# Patient Record
Sex: Female | Born: 1965 | Hispanic: No | Marital: Married | Smoking: Never smoker
Health system: Southern US, Community
[De-identification: ages and names within clinical notes are randomized; demographics above are authoritative.]

---

## 2006-02-06 ENCOUNTER — Other Ambulatory Visit: Admission: RE | Admit: 2006-02-06 | Discharge: 2006-02-06 | Payer: Self-pay | Admitting: Family Medicine

## 2007-03-27 ENCOUNTER — Other Ambulatory Visit: Admission: RE | Admit: 2007-03-27 | Discharge: 2007-03-27 | Payer: Self-pay | Admitting: Family Medicine

## 2008-10-14 ENCOUNTER — Other Ambulatory Visit: Admission: RE | Admit: 2008-10-14 | Discharge: 2008-10-14 | Payer: Self-pay | Admitting: Family Medicine

## 2010-03-20 ENCOUNTER — Encounter: Payer: Self-pay | Admitting: Family Medicine

## 2011-11-28 ENCOUNTER — Telehealth: Payer: Self-pay | Admitting: *Deleted

## 2011-11-28 ENCOUNTER — Ambulatory Visit (INDEPENDENT_AMBULATORY_CARE_PROVIDER_SITE_OTHER): Payer: BC Managed Care – PPO | Admitting: Gynecology

## 2011-11-28 ENCOUNTER — Encounter: Payer: Self-pay | Admitting: Gynecology

## 2011-11-28 VITALS — BP 140/92 | Ht 65.0 in | Wt 140.0 lb

## 2011-11-28 DIAGNOSIS — N63 Unspecified lump in unspecified breast: Secondary | ICD-10-CM

## 2011-11-28 DIAGNOSIS — N644 Mastodynia: Secondary | ICD-10-CM

## 2011-11-28 DIAGNOSIS — N6310 Unspecified lump in the right breast, unspecified quadrant: Secondary | ICD-10-CM

## 2011-11-28 NOTE — Telephone Encounter (Signed)
Order placed for bil. mammogram.

## 2011-11-28 NOTE — Telephone Encounter (Signed)
Message copied by Aura Camps on Tue Nov 28, 2011  4:19 PM ------      Message from: Ok Edwards      Created: Tue Nov 28, 2011  4:06 PM       Victorino Dike, please schedule diagnostic mammogram of the right breast ( mass periareolar region from 4- 6 0'clock position). Thanks JF

## 2011-11-28 NOTE — Patient Instructions (Addendum)
Breast Cyst A breast cyst is a sac in your breast that is filled with fluid. This is common in women. Women can have one or many cysts. When the breasts contain many cysts, it is usually due to a noncancerous (benign) condition called fibrocystic change. These lumps form under the influence of female hormones (estrogen and progesterone). The lumps are most often located in the upper, outer portion of the breast. They are often more swollen, painful, and tender before your period starts. They usually disappear after menopause, unless you are on hormone therapy. Different types of cysts:  Macrocysts. These are cysts that are about 2 inches (5.1 cm) in diameter.  Microcysts. These are tiny cysts that you cannot feel, but that are seen with a mammogram or an ultrasound.  Galactocele. This is a cyst containing milk, which develops when and if you suddenly stop breastfeeding.  Sebaceous cyst of the skin (not in the breast tissue itself). These are not cancerous. Breast cysts do not increase your chance of getting breast cancer. However, they must be followed and treated closely, because a cyst can be cancerous. Be sure to see your caregiver for follow-up care as recommended.  CAUSES   It is not completely known what causes a breast cyst.  Estrogen may influence the development of a breast cyst.  An overgrowth of milk glands and connective tissue in the breast can block the milk glands, causing them to fill with fluid.  Scar tissue in the breast from previous surgery may block the glands, causing a cyst. SYMPTOMS   Feeling a smooth, round, soft lump (like a grape) in the breast that is easily moveable.  Breast discomfort or pain, especially in the area of the cyst.  Increase in size of the lump before your menstrual period, and decrease in size after your menstrual period. DIAGNOSIS   The cyst can be felt during an exam by your caregiver.  Mammogram (breast X-ray).  Ultrasound.  Removing  fluid from the cyst with a needle (fine needle aspiration). TREATMENT   Your caregiver may feel there is no reason for treatment. He or she may watch to see if it goes away on its own.  Hormone treatment.  Needle aspiration. There is a 40% chance of the cyst recurring after aspiration.  Surgery to remove the whole cyst. HOME CARE INSTRUCTIONS   Get a yearly exam by your caregiver.  Practice "breast self-awareness." This means understanding the normal appearance and feel of your breasts and may include breast self-examination.  Have a clinical breast exam (CBE) by a caregiver every 1 to 3 years if you are 20 to 45 years of age. After age 40, you should have a CBE every year.  Get mammogram tests as directed by your caregiver.  Only take over-the-counter pain medicine as directed by your caregiver.  Wear a good support bra, especially when exercising.  Avoid caffeine.  Reduce your salt intake, especially before your menstrual period. Too much salt can cause fluid retention, breast swelling, and discomfort. SEEK MEDICAL CARE IF:   You feel, or think you feel, a lump in your breast.  You notice that both breasts look different than usual.  You notice that both breasts feel different than before.  Your breast is still causing pain, after your menstrual period is over.  You need medicine for breast pain and swelling that occurs with your menstrual period. SEEK IMMEDIATE MEDICAL CARE IF:   You develop severe pain, tenderness, redness, or warmth in your   causing pain, after your menstrual period is over.   You need medicine for breast pain and swelling that occurs with your menstrual period.  SEEK IMMEDIATE MEDICAL CARE IF:    You develop severe pain, tenderness, redness, or warmth in your breast.   You develop nipple discharge or bleeding.   Your breast lump becomes hard and painful.   You find new lumps or bumps that were not there before.   You feel lumps in your armpit (axilla).   You notice dimpling or wrinkling of the breast or nipple.   You have a fever.  Document Released: 02/13/2005 Document Revised: 05/08/2011 Document Reviewed: 06/05/2008  [employer] Patient Information 2013 [employer].

## 2011-11-28 NOTE — Progress Notes (Signed)
Patient is a 46 year old who presented to the office with complaint of a right tender area since August of this year. She was in [place] in August of 2013 because of the complaints she went to the doctor who ordered what appears to be an ultrasound for right breast and she brought the report but since it is in [place] language I could not interpreted it. It appears she may have had a right breast cyst. They had informed her to followup in 3 months. This is the reason she came to the office today. Her last mammogram was in November of 2012 which she stated was normal. There using condoms for contraception. She's having normal menstrual cycles. She stated in August and she or her complete gynecological examination and Pap smear was done.  Breast exam were undertaken the sitting and supine position:  Physical Exam  Pulmonary/Chest:     there were no other palpable masses and no supraclavicular or axillary lymphadenopathy on either breast.   Assessment/plan: Right. Areolar mass tender from 4 to 7:00 position. Patient will be sent for a diagnostic mammogram this week. She will bring a picture of the report from [place] to that appointment for the radiologist to review as well.

## 2011-11-29 ENCOUNTER — Other Ambulatory Visit: Payer: Self-pay | Admitting: Gynecology

## 2011-11-29 DIAGNOSIS — N63 Unspecified lump in unspecified breast: Secondary | ICD-10-CM

## 2011-11-30 NOTE — Telephone Encounter (Signed)
Breast center left message on 11/29/11 for pt to call.

## 2011-12-05 NOTE — Telephone Encounter (Signed)
Appointment 12/11/11 @ 1:10pm

## 2011-12-11 ENCOUNTER — Ambulatory Visit
Admission: RE | Admit: 2011-12-11 | Discharge: 2011-12-11 | Disposition: A | Discharge status: Home / Self Care | Payer: BC Managed Care – PPO | Source: Ambulatory Visit | Attending: Gynecology | Admitting: Gynecology

## 2011-12-11 DIAGNOSIS — N63 Unspecified lump in unspecified breast: Secondary | ICD-10-CM

## 2012-08-19 ENCOUNTER — Other Ambulatory Visit: Payer: Self-pay

## 2012-08-19 DIAGNOSIS — Z1231 Encounter for screening mammogram for malignant neoplasm of breast: Secondary | ICD-10-CM

## 2012-09-11 ENCOUNTER — Ambulatory Visit
Admission: RE | Admit: 2012-09-11 | Discharge: 2012-09-11 | Disposition: A | Discharge status: Home / Self Care | Payer: BC Managed Care – PPO | Source: Ambulatory Visit

## 2012-09-11 DIAGNOSIS — Z1231 Encounter for screening mammogram for malignant neoplasm of breast: Secondary | ICD-10-CM

## 2013-12-29 ENCOUNTER — Encounter: Payer: Self-pay | Admitting: Gynecology

## 2016-03-22 ENCOUNTER — Other Ambulatory Visit: Payer: Self-pay | Admitting: Family Medicine

## 2016-03-22 DIAGNOSIS — Z1231 Encounter for screening mammogram for malignant neoplasm of breast: Secondary | ICD-10-CM

## 2016-04-10 ENCOUNTER — Ambulatory Visit: Payer: Self-pay

## 2016-04-18 ENCOUNTER — Ambulatory Visit
Admission: RE | Admit: 2016-04-18 | Discharge: 2016-04-18 | Disposition: A | Discharge status: Home / Self Care | Payer: 59 | Source: Ambulatory Visit | Attending: Family Medicine | Admitting: Family Medicine

## 2016-04-18 DIAGNOSIS — Z1231 Encounter for screening mammogram for malignant neoplasm of breast: Secondary | ICD-10-CM

## 2018-03-27 ENCOUNTER — Other Ambulatory Visit: Payer: Self-pay | Admitting: Family Medicine

## 2018-03-27 DIAGNOSIS — Z1231 Encounter for screening mammogram for malignant neoplasm of breast: Secondary | ICD-10-CM

## 2018-04-25 ENCOUNTER — Other Ambulatory Visit: Payer: Self-pay | Admitting: Family Medicine

## 2018-04-25 ENCOUNTER — Ambulatory Visit
Admission: RE | Admit: 2018-04-25 | Discharge: 2018-04-25 | Disposition: A | Discharge status: Home / Self Care | Payer: BC Managed Care – PPO | Source: Ambulatory Visit | Attending: Family Medicine | Admitting: Family Medicine

## 2018-04-25 DIAGNOSIS — Z1231 Encounter for screening mammogram for malignant neoplasm of breast: Secondary | ICD-10-CM

## 2019-04-26 ENCOUNTER — Ambulatory Visit: Payer: BC Managed Care – PPO | Attending: Internal Medicine

## 2019-04-26 DIAGNOSIS — Z23 Encounter for immunization: Secondary | ICD-10-CM

## 2019-04-26 NOTE — Progress Notes (Signed)
   Covid-19 Vaccination Clinic  Name:  Jill Lowe    MRN: [id number] DOB: 1966/02/08  04/26/2019  Jill Lowe was observed post Covid-19 immunization for 15 minutes without incidence. She was provided with Vaccine Information Sheet and instruction to access the V-Safe system.   Jill Lowe was instructed to call 911 with any severe reactions post vaccine: Marland Kitchen Difficulty breathing  . Swelling of your face and throat  . A fast heartbeat  . A bad rash all over your body  . Dizziness and weakness    Immunizations Administered    Name Date Dose VIS Date Route   Pfizer COVID-19 Vaccine 04/26/2019  5:29 PM 0.3 mL 02/07/2019 Intramuscular   Manufacturer: [employer], [employer]   Lot: [id number]   NDC: [address]-8

## 2019-05-17 ENCOUNTER — Ambulatory Visit: Payer: BC Managed Care – PPO | Attending: Internal Medicine

## 2019-05-17 DIAGNOSIS — Z23 Encounter for immunization: Secondary | ICD-10-CM

## 2019-05-17 NOTE — Progress Notes (Signed)
   Covid-19 Vaccination Clinic  Name:  Jill Lowe    MRN: [id number] DOB: [birth year]  05/17/2019  [name] was observed post Covid-19 immunization for 15 minutes without incident. She was provided with Vaccine Information Sheet and instruction to access the V-Safe system.   [name] was instructed to call 911 with any severe reactions post vaccine: Marland Kitchen Difficulty breathing  . Swelling of face and throat  . A fast heartbeat  . A bad rash all over body  . Dizziness and weakness   Immunizations Administered    Name Date Dose VIS Date Route   Pfizer COVID-19 Vaccine 05/17/2019  9:28 AM 0.3 mL 02/07/2019 Intramuscular   Manufacturer: [employer], [employer]   Lot: [id number]   NDC: [address]-8

## 2019-05-28 ENCOUNTER — Ambulatory Visit
Admission: RE | Admit: 2019-05-28 | Discharge: 2019-05-28 | Disposition: A | Discharge status: Home / Self Care | Payer: BC Managed Care – PPO | Source: Ambulatory Visit | Attending: Family Medicine | Admitting: Family Medicine

## 2019-05-28 ENCOUNTER — Other Ambulatory Visit: Payer: Self-pay

## 2019-05-28 ENCOUNTER — Other Ambulatory Visit: Payer: Self-pay | Admitting: Family Medicine

## 2019-05-28 DIAGNOSIS — Z1231 Encounter for screening mammogram for malignant neoplasm of breast: Secondary | ICD-10-CM

## 2020-05-19 ENCOUNTER — Other Ambulatory Visit: Payer: Self-pay | Admitting: Internal Medicine

## 2020-05-19 DIAGNOSIS — Z1231 Encounter for screening mammogram for malignant neoplasm of breast: Secondary | ICD-10-CM

## 2020-07-12 ENCOUNTER — Other Ambulatory Visit: Payer: Self-pay

## 2020-07-12 ENCOUNTER — Ambulatory Visit
Admission: RE | Admit: 2020-07-12 | Discharge: 2020-07-12 | Disposition: A | Discharge status: Home / Self Care | Payer: BC Managed Care – PPO | Source: Ambulatory Visit | Attending: Internal Medicine | Admitting: Internal Medicine

## 2020-07-12 DIAGNOSIS — Z1231 Encounter for screening mammogram for malignant neoplasm of breast: Secondary | ICD-10-CM

## 2020-07-13 ENCOUNTER — Other Ambulatory Visit: Payer: Self-pay | Admitting: Internal Medicine

## 2020-07-13 DIAGNOSIS — R928 Other abnormal and inconclusive findings on diagnostic imaging of breast: Secondary | ICD-10-CM

## 2020-08-02 ENCOUNTER — Other Ambulatory Visit: Payer: Self-pay

## 2020-08-02 ENCOUNTER — Other Ambulatory Visit: Payer: Self-pay | Admitting: Internal Medicine

## 2020-08-02 ENCOUNTER — Ambulatory Visit
Admission: RE | Admit: 2020-08-02 | Discharge: 2020-08-02 | Disposition: A | Discharge status: Home / Self Care | Payer: BC Managed Care – PPO | Source: Ambulatory Visit | Attending: Internal Medicine | Admitting: Internal Medicine

## 2020-08-02 DIAGNOSIS — R928 Other abnormal and inconclusive findings on diagnostic imaging of breast: Secondary | ICD-10-CM

## 2020-08-02 DIAGNOSIS — N6489 Other specified disorders of breast: Secondary | ICD-10-CM

## 2020-08-05 ENCOUNTER — Other Ambulatory Visit: Payer: Self-pay | Admitting: Internal Medicine

## 2020-08-05 ENCOUNTER — Other Ambulatory Visit: Payer: Self-pay

## 2020-08-05 ENCOUNTER — Ambulatory Visit
Admission: RE | Admit: 2020-08-05 | Discharge: 2020-08-05 | Disposition: A | Discharge status: Home / Self Care | Payer: BC Managed Care – PPO | Source: Ambulatory Visit | Attending: Internal Medicine | Admitting: Internal Medicine

## 2020-08-05 DIAGNOSIS — N6489 Other specified disorders of breast: Secondary | ICD-10-CM

## 2020-11-03 ENCOUNTER — Other Ambulatory Visit: Payer: Self-pay | Admitting: Internal Medicine

## 2020-11-03 DIAGNOSIS — N6489 Other specified disorders of breast: Secondary | ICD-10-CM

## 2021-03-01 ENCOUNTER — Other Ambulatory Visit: Payer: Self-pay | Admitting: Internal Medicine

## 2021-03-01 ENCOUNTER — Other Ambulatory Visit: Payer: Self-pay

## 2021-03-01 ENCOUNTER — Ambulatory Visit
Admission: RE | Admit: 2021-03-01 | Discharge: 2021-03-01 | Disposition: A | Discharge status: Home / Self Care | Payer: BC Managed Care – PPO | Source: Ambulatory Visit | Attending: Internal Medicine | Admitting: Internal Medicine

## 2021-03-01 DIAGNOSIS — N6489 Other specified disorders of breast: Secondary | ICD-10-CM

## 2021-09-12 ENCOUNTER — Other Ambulatory Visit: Payer: BC Managed Care – PPO

## 2021-09-12 ENCOUNTER — Other Ambulatory Visit: Payer: Self-pay | Admitting: Internal Medicine

## 2021-09-12 ENCOUNTER — Ambulatory Visit
Admission: RE | Admit: 2021-09-12 | Discharge: 2021-09-12 | Disposition: A | Discharge status: Home / Self Care | Payer: BC Managed Care – PPO | Source: Ambulatory Visit | Attending: Internal Medicine | Admitting: Internal Medicine

## 2021-09-12 DIAGNOSIS — N6489 Other specified disorders of breast: Secondary | ICD-10-CM

## 2021-09-23 ENCOUNTER — Ambulatory Visit
Admission: RE | Admit: 2021-09-23 | Discharge: 2021-09-23 | Disposition: A | Discharge status: Home / Self Care | Payer: BC Managed Care – PPO | Source: Ambulatory Visit | Attending: Internal Medicine | Admitting: Internal Medicine

## 2021-09-23 DIAGNOSIS — N6489 Other specified disorders of breast: Secondary | ICD-10-CM

## 2022-02-22 ENCOUNTER — Other Ambulatory Visit: Payer: Self-pay | Admitting: Internal Medicine

## 2022-02-22 ENCOUNTER — Encounter: Payer: Self-pay | Admitting: Internal Medicine

## 2022-02-22 DIAGNOSIS — R921 Mammographic calcification found on diagnostic imaging of breast: Secondary | ICD-10-CM

## 2022-04-24 ENCOUNTER — Ambulatory Visit
Admission: RE | Admit: 2022-04-24 | Discharge: 2022-04-24 | Disposition: A | Discharge status: Home / Self Care | Payer: BC Managed Care – PPO | Source: Ambulatory Visit | Attending: Internal Medicine | Admitting: Internal Medicine

## 2022-04-24 ENCOUNTER — Ambulatory Visit: Payer: BC Managed Care – PPO

## 2022-04-24 ENCOUNTER — Other Ambulatory Visit: Payer: Self-pay | Admitting: Internal Medicine

## 2022-04-24 DIAGNOSIS — R921 Mammographic calcification found on diagnostic imaging of breast: Secondary | ICD-10-CM

## 2022-04-25 ENCOUNTER — Other Ambulatory Visit: Payer: BC Managed Care – PPO

## 2022-09-27 ENCOUNTER — Other Ambulatory Visit: Payer: BC Managed Care – PPO

## 2022-10-31 ENCOUNTER — Ambulatory Visit
Admission: RE | Admit: 2022-10-31 | Discharge: 2022-10-31 | Disposition: A | Discharge status: Home / Self Care | Payer: BC Managed Care – PPO | Source: Ambulatory Visit | Attending: Internal Medicine | Admitting: Internal Medicine

## 2022-10-31 DIAGNOSIS — R921 Mammographic calcification found on diagnostic imaging of breast: Secondary | ICD-10-CM

## 2023-01-11 ENCOUNTER — Ambulatory Visit (HOSPITAL_BASED_OUTPATIENT_CLINIC_OR_DEPARTMENT_OTHER): Admit: 2023-01-11 | Payer: BC Managed Care – PPO | Admitting: Obstetrics and Gynecology

## 2023-01-11 ENCOUNTER — Encounter (HOSPITAL_BASED_OUTPATIENT_CLINIC_OR_DEPARTMENT_OTHER): Payer: Self-pay

## 2023-01-11 SURGERY — HYSTERECTOMY, TOTAL, LAPAROSCOPIC, WITH SALPINGECTOMY
Anesthesia: General

## 2023-04-03 IMAGING — MG MM DIGITAL DIAGNOSTIC UNILAT*R* W/ TOMO W/ CAD
4 series · 4 of 12 positions shown · non-contrast
Comparison: Previous exam(s).

CLINICAL DATA: Short-term follow-up for a probably benign right
breast mass. Patient also had an apparent architectural distortion.
Stereotactic biopsy was attempted on 08/05/2020, but the distortion
could not be reproduced.

EXAM:
DIGITAL DIAGNOSTIC UNILATERAL RIGHT MAMMOGRAM WITH TOMOSYNTHESIS AND
CAD; ULTRASOUND RIGHT BREAST LIMITED
TECHNIQUE: Right digital diagnostic mammography and breast tomosynthesis was
performed. The images were evaluated with computer-aided detection.;
Targeted ultrasound examination of the right breast was performed

[R CC synth-2D]
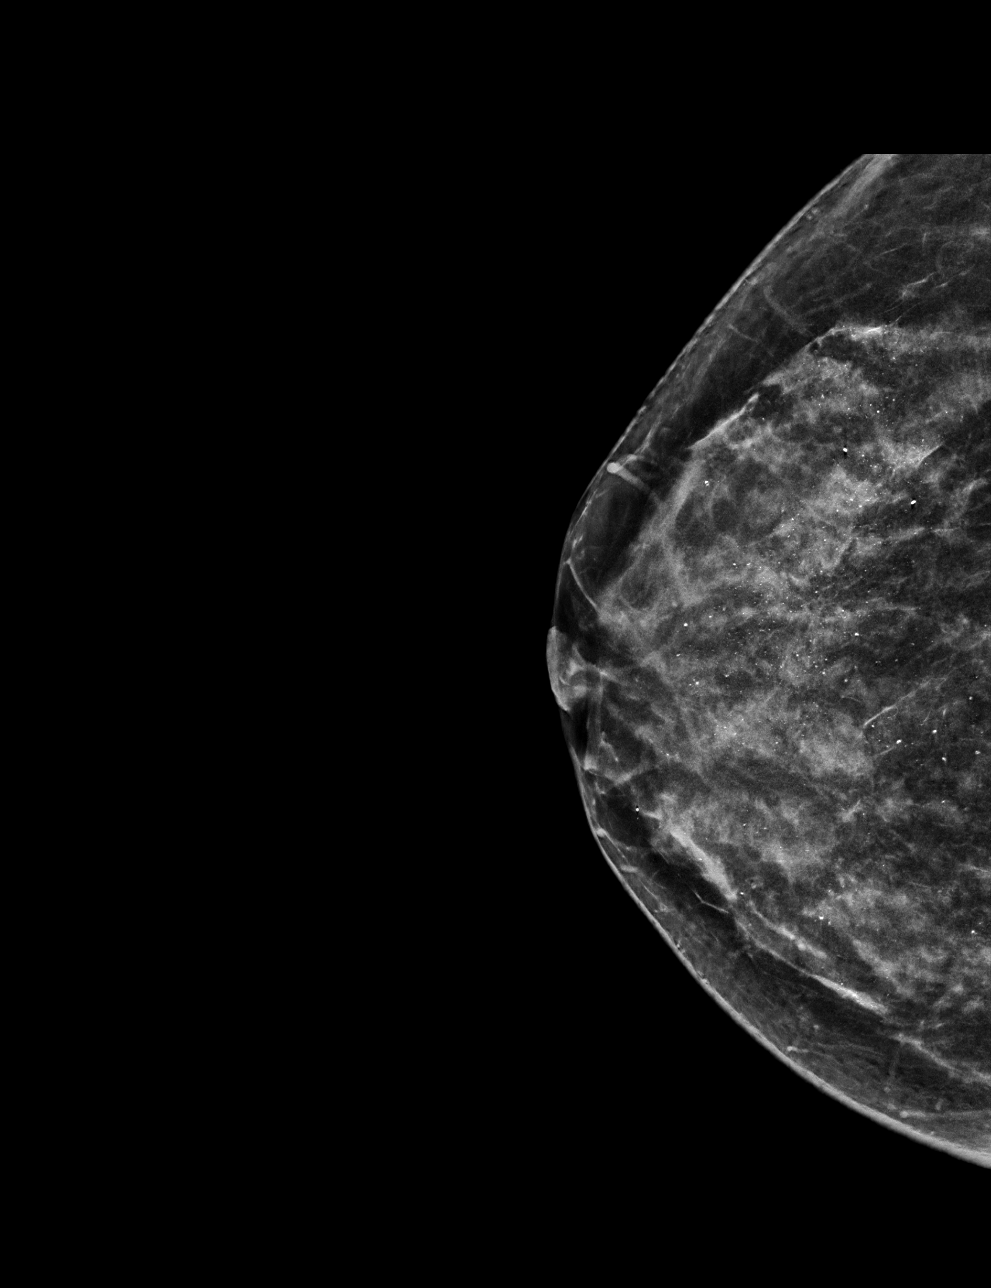

[R MLO synth-2D]
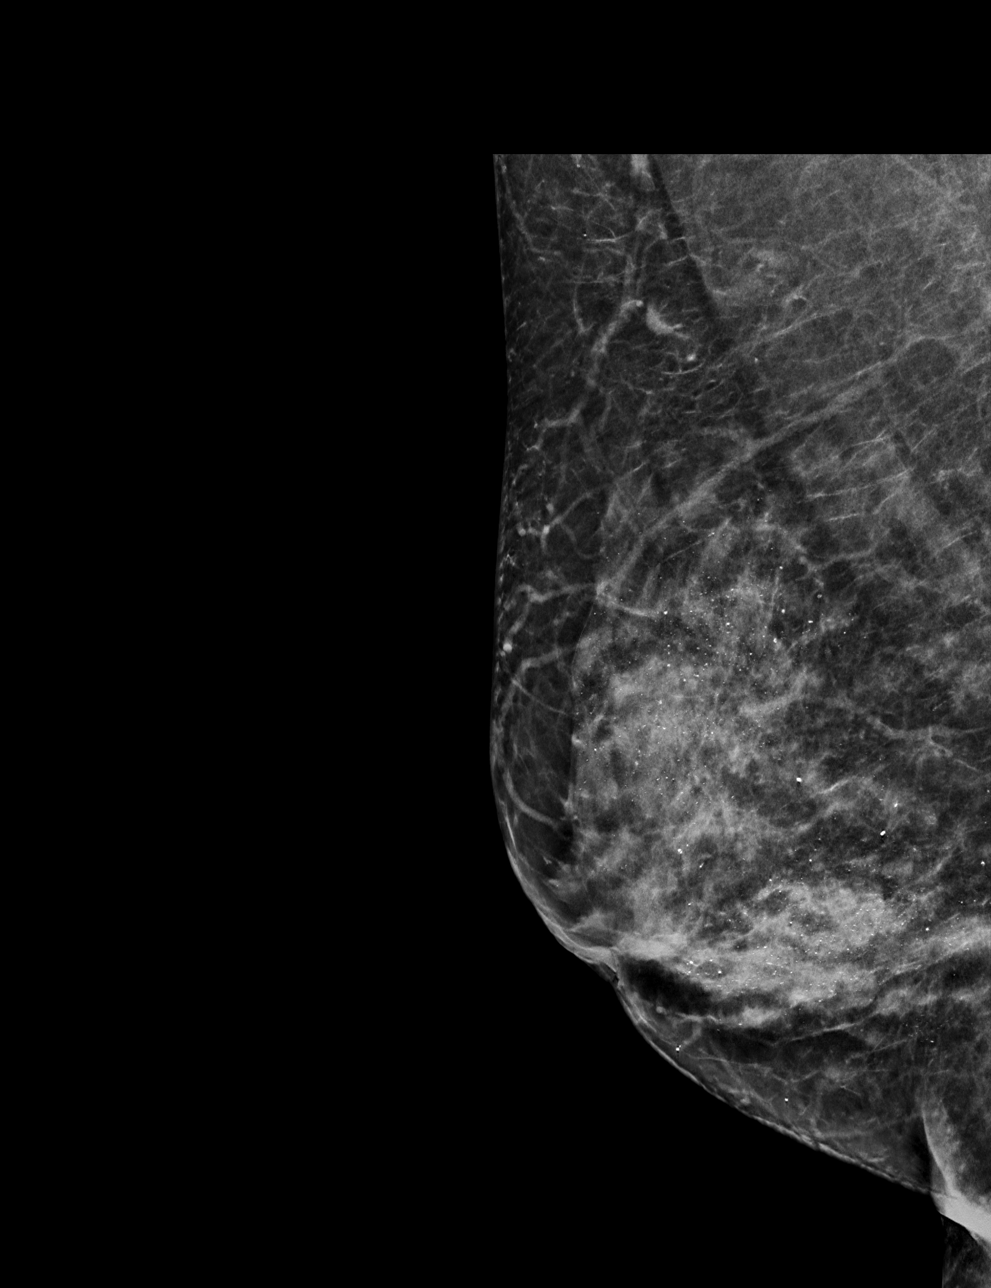

[R MLO tomo · tomo slice 30/59.0]
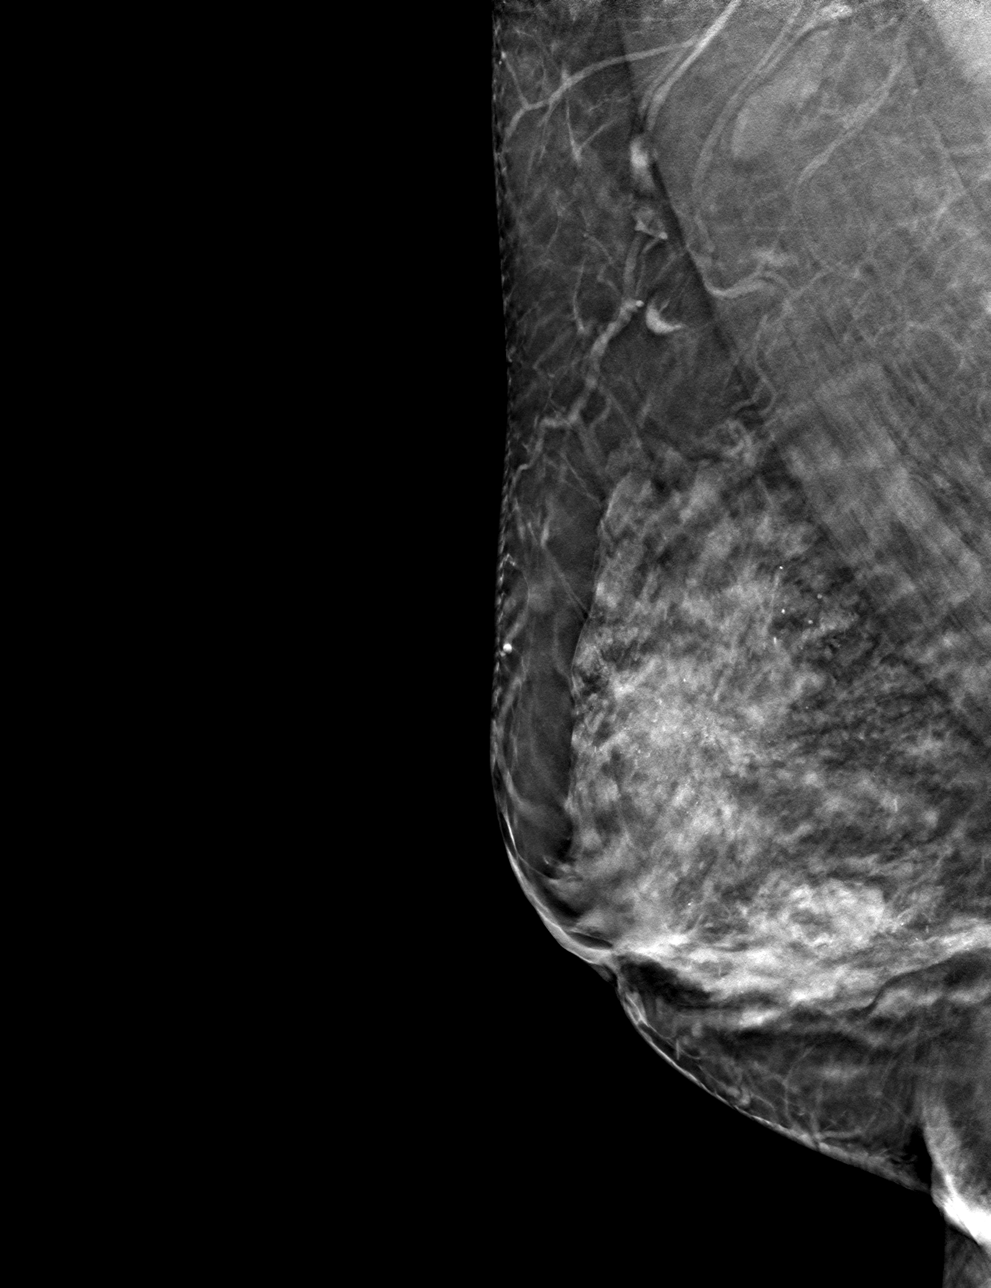

[R CC tomo · tomo slice 27/54.0]
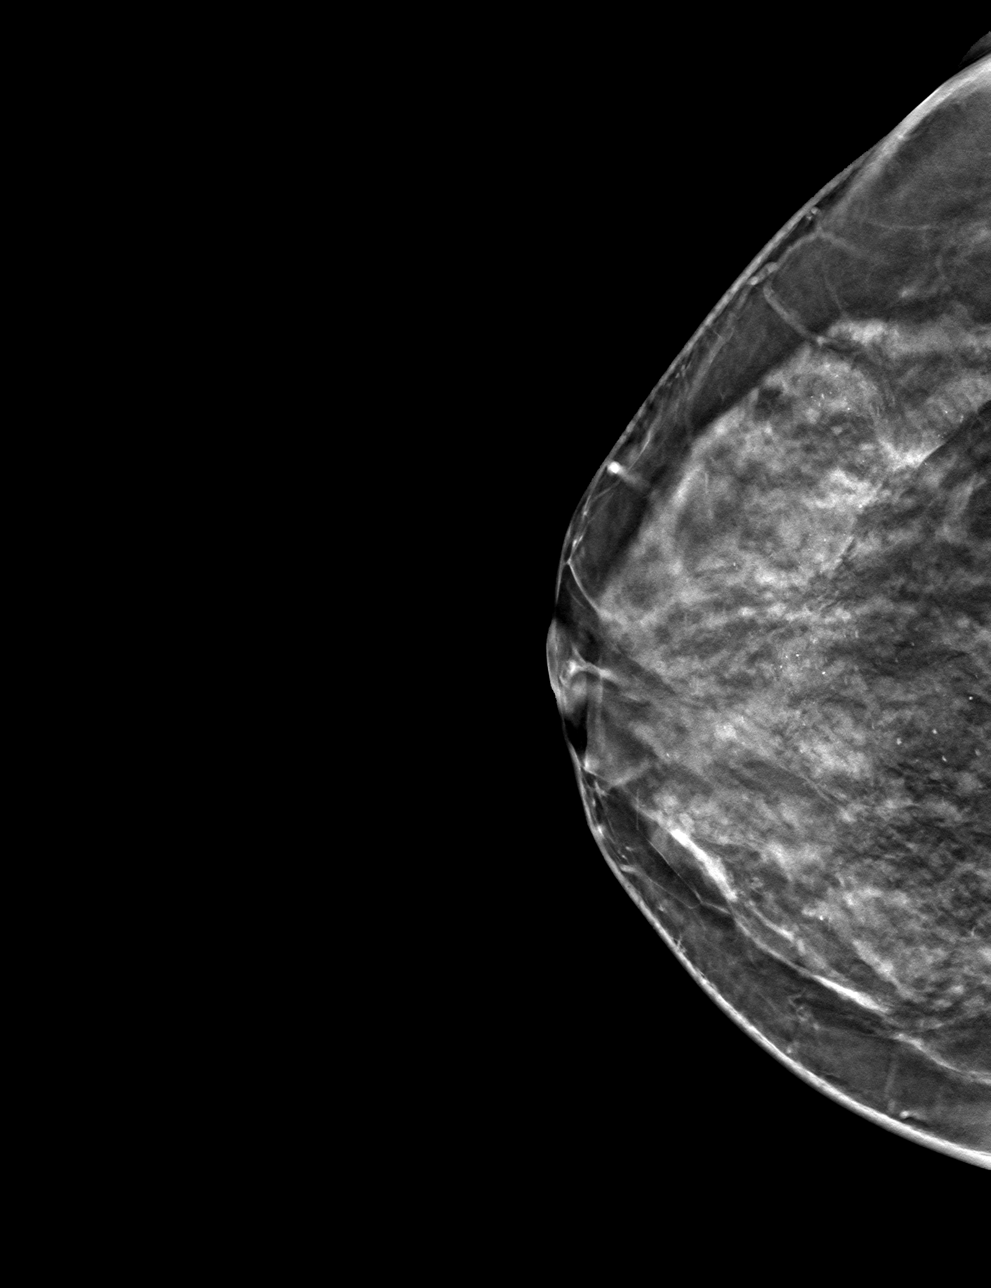

[4 of 12 positions shown; findings below may reference images not displayed]

ACR Breast Density Category d: The breast tissue is extremely dense,
which lowers the sensitivity of mammography.
FINDINGS: The previously seen subtle area of apparent distortion in the right
breast is less apparent. The architecture in the upper outer
quadrant appears somewhat irregular, but there are no true
converging lines. There is no defined mass. There are diffuse small
round calcifications and are stable. No suspicious calcifications.
Right breast

Targeted ultrasound is performed, showing an oval nearly anechoic
mass with circumscribed margins and increased through transmission
at 9 o'clock, 5 cm the nipple, measuring 8 x 4 x 7 mm, unchanged.
IMPRESSION: 1. No convincing true architectural distortion in the right breast,
assessment somewhat limited by the extreme fibroglandular density
2. Probably benign right breast mass on sonography at 9 o'clock, 5
cm the nipple, almost certainly a cyst.

RECOMMENDATION:
1. Additional short-term follow-up with diagnostic bilateral
mammography and right breast ultrasound in 6 months.

I have discussed the findings and recommendations with the patient.
If applicable, a reminder letter will be sent to the patient
regarding the next appointment.

BI-RADS CATEGORY  3: Probably benign.

## 2023-04-03 IMAGING — US US BREAST*R* LIMITED INC AXILLA
1 series · 6 of 6 positions shown · non-contrast
Comparison: Previous exam(s).

CLINICAL DATA: Short-term follow-up for a probably benign right
breast mass. Patient also had an apparent architectural distortion.
Stereotactic biopsy was attempted on 08/05/2020, but the distortion
could not be reproduced.

EXAM:
DIGITAL DIAGNOSTIC UNILATERAL RIGHT MAMMOGRAM WITH TOMOSYNTHESIS AND
CAD; ULTRASOUND RIGHT BREAST LIMITED
TECHNIQUE: Right digital diagnostic mammography and breast tomosynthesis was
performed. The images were evaluated with computer-aided detection.;
Targeted ultrasound examination of the right breast was performed

[Series 1: us breast*right* limited inc axilla · 0.06mm/px · 6 of 6 slices shown]
[im 1/6]
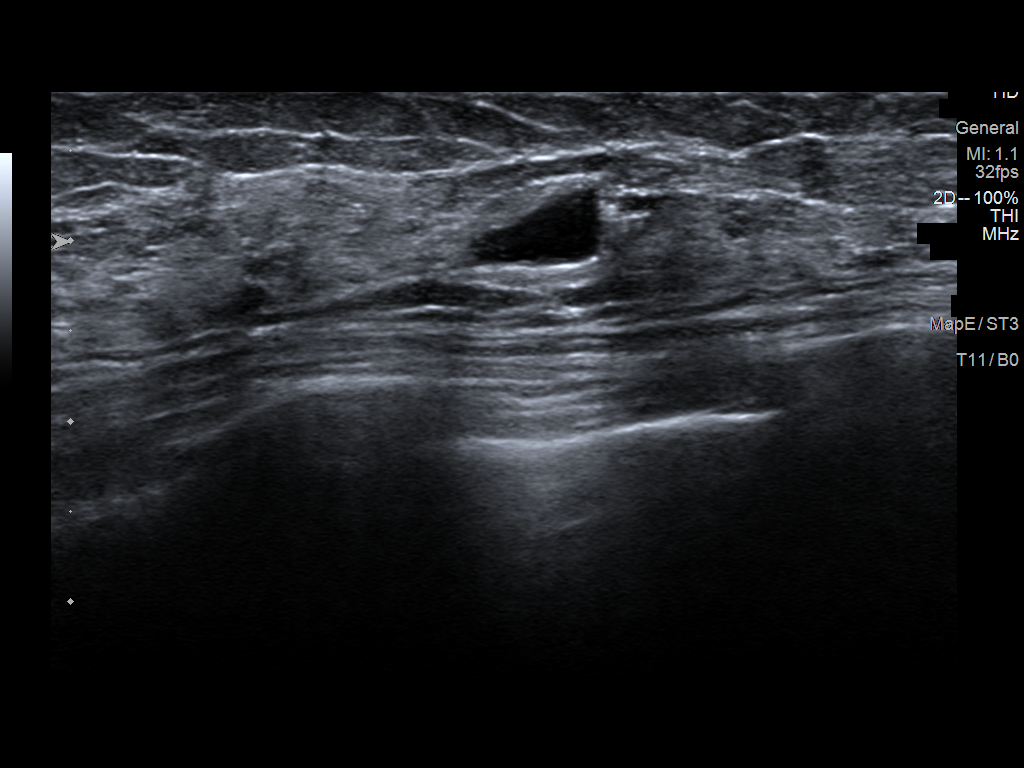
[im 2/6]
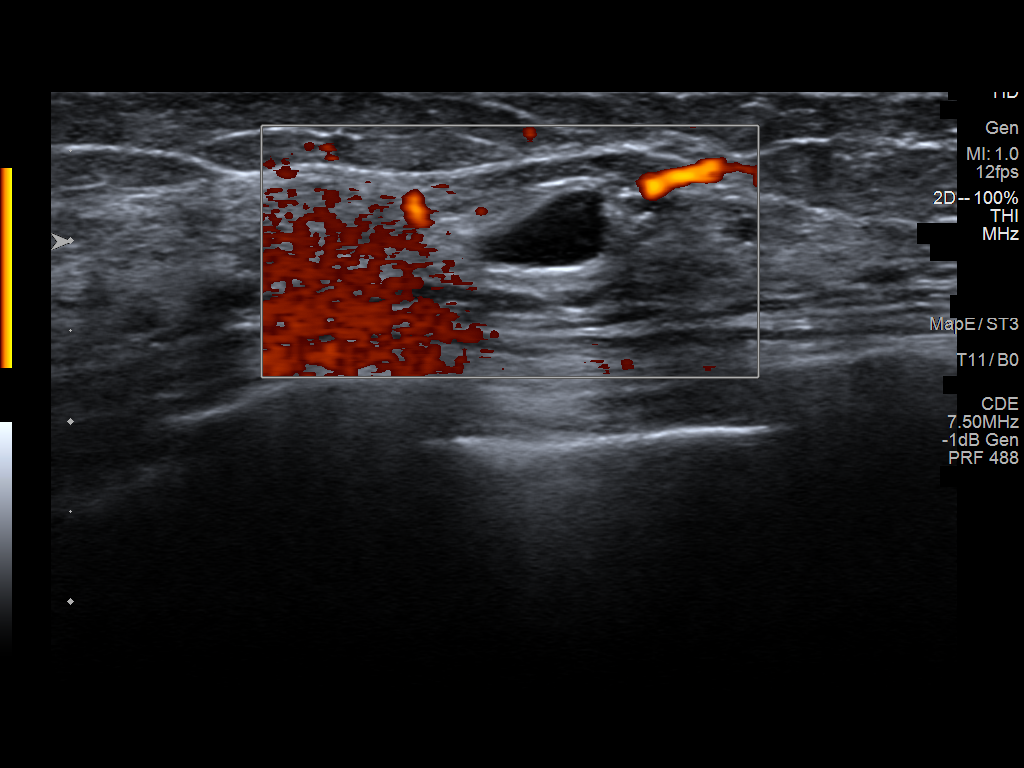
[im 3/6]
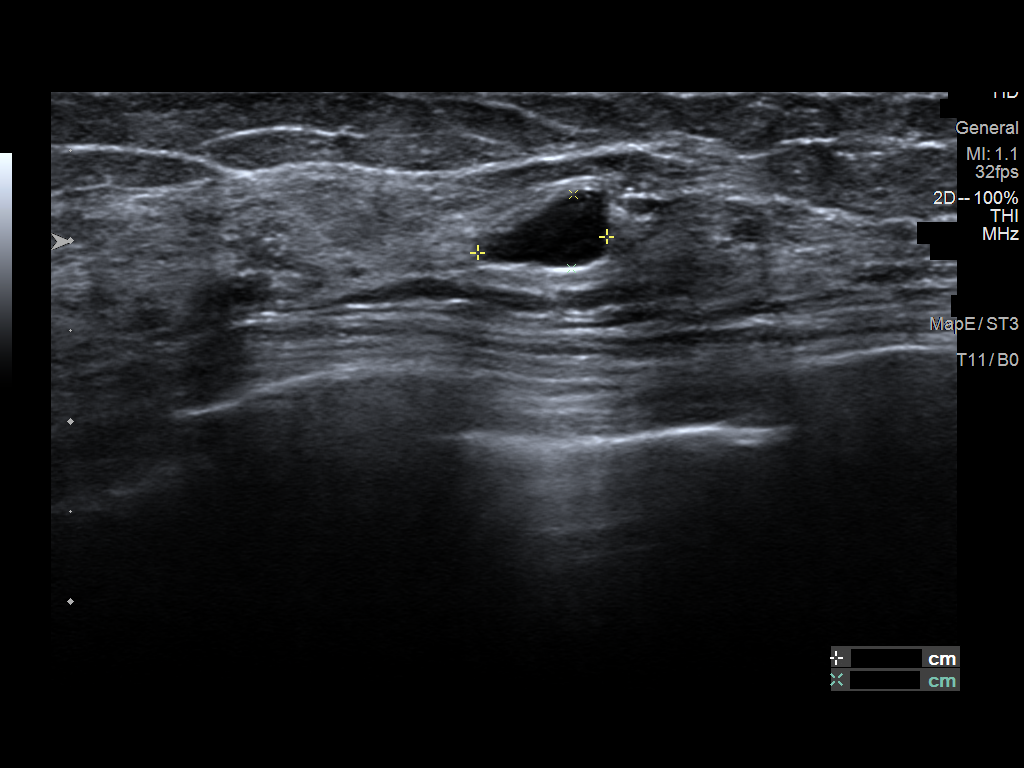
[im 4/6]
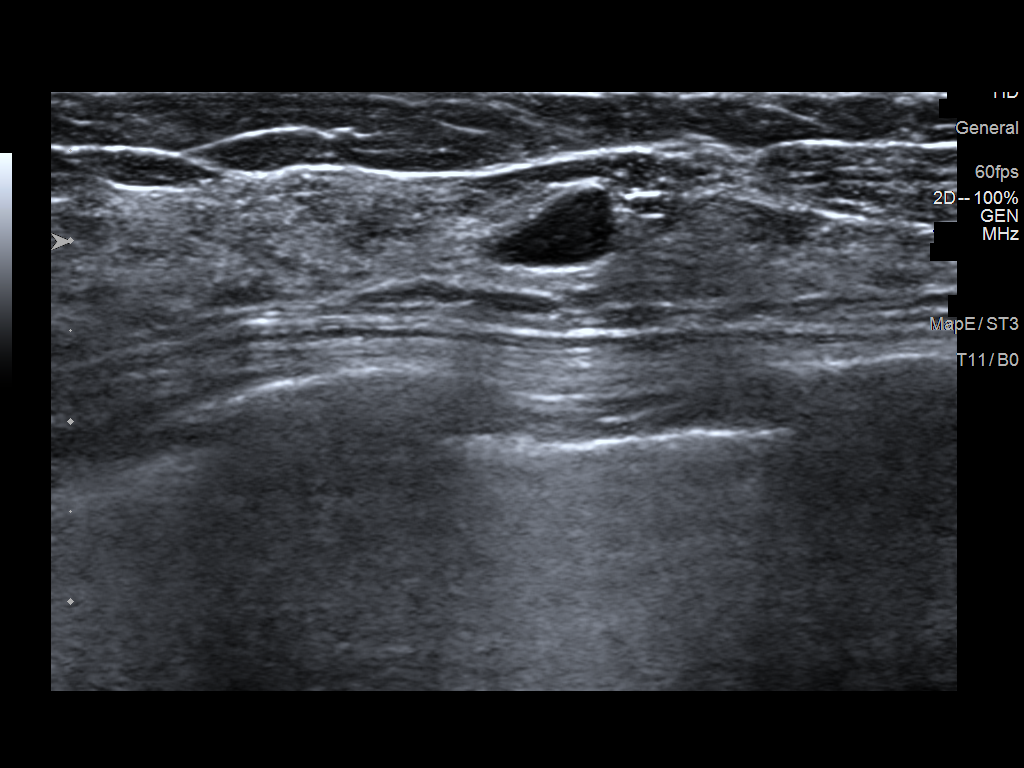
[im 5/6]
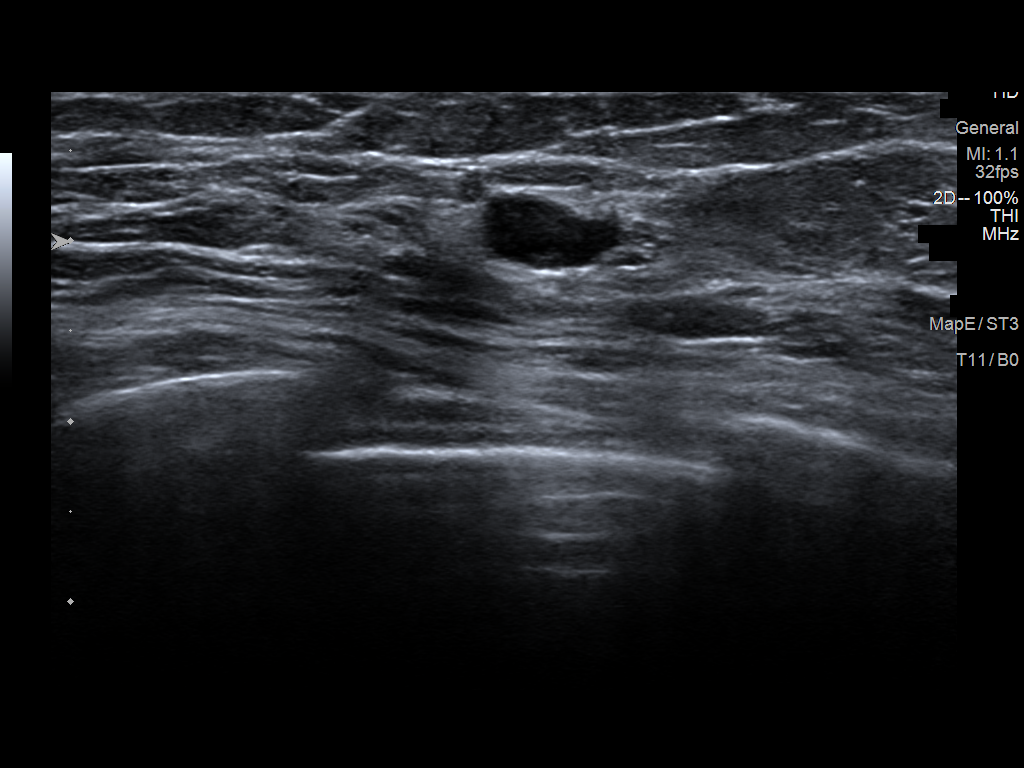
[im 6/6]
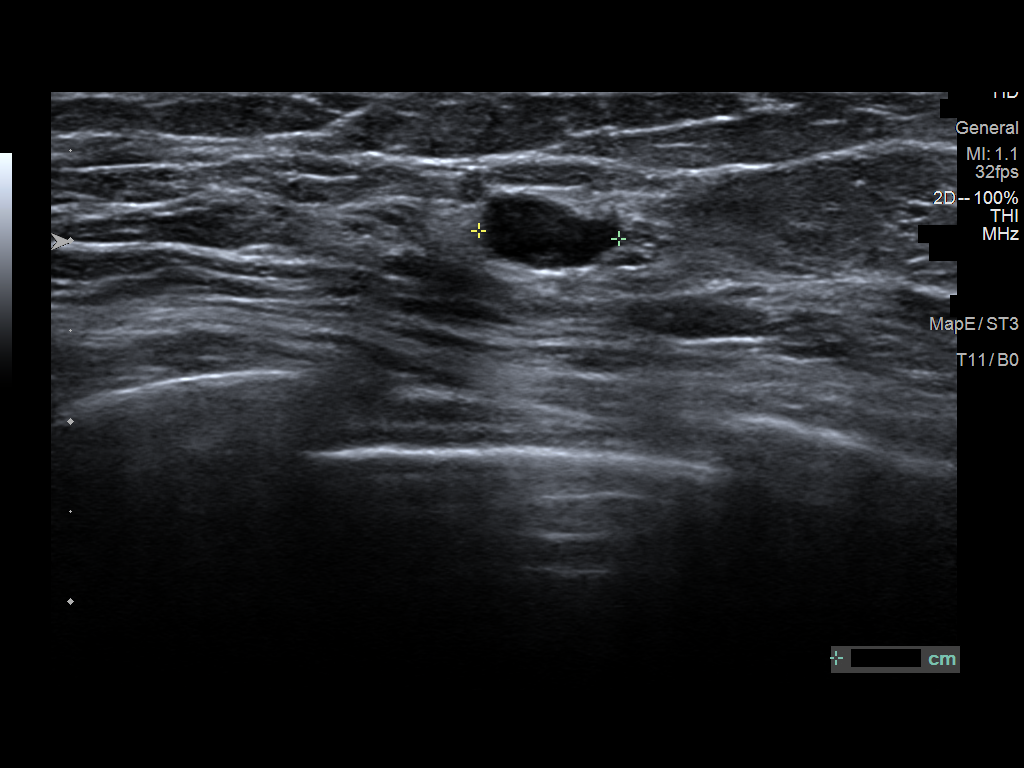

[6 of 6 positions shown; findings below may reference images not displayed]

ACR Breast Density Category d: The breast tissue is extremely dense,
which lowers the sensitivity of mammography.
FINDINGS: The previously seen subtle area of apparent distortion in the right
breast is less apparent. The architecture in the upper outer
quadrant appears somewhat irregular, but there are no true
converging lines. There is no defined mass. There are diffuse small
round calcifications and are stable. No suspicious calcifications.
Right breast

Targeted ultrasound is performed, showing an oval nearly anechoic
mass with circumscribed margins and increased through transmission
at 9 o'clock, 5 cm the nipple, measuring 8 x 4 x 7 mm, unchanged.
IMPRESSION: 1. No convincing true architectural distortion in the right breast,
assessment somewhat limited by the extreme fibroglandular density
2. Probably benign right breast mass on sonography at 9 o'clock, 5
cm the nipple, almost certainly a cyst.

RECOMMENDATION:
1. Additional short-term follow-up with diagnostic bilateral
mammography and right breast ultrasound in 6 months.

I have discussed the findings and recommendations with the patient.
If applicable, a reminder letter will be sent to the patient
regarding the next appointment.

BI-RADS CATEGORY  3: Probably benign.

## 2024-01-03 ENCOUNTER — Other Ambulatory Visit (HOSPITAL_BASED_OUTPATIENT_CLINIC_OR_DEPARTMENT_OTHER): Payer: Self-pay | Admitting: Internal Medicine

## 2024-01-03 DIAGNOSIS — Z780 Asymptomatic menopausal state: Secondary | ICD-10-CM

## 2024-01-03 DIAGNOSIS — Z1231 Encounter for screening mammogram for malignant neoplasm of breast: Secondary | ICD-10-CM
